# Patient Record
Sex: Male | Born: 1960 | Race: White | Hispanic: No | Smoking: Never smoker
Health system: Southern US, Community
[De-identification: ages and names within clinical notes are randomized; demographics above are authoritative.]

## PROBLEM LIST (undated history)

## (undated) DIAGNOSIS — G4733 Obstructive sleep apnea (adult) (pediatric): Secondary | ICD-10-CM

## (undated) DIAGNOSIS — H9190 Unspecified hearing loss, unspecified ear: Secondary | ICD-10-CM

## (undated) DIAGNOSIS — R42 Dizziness and giddiness: Secondary | ICD-10-CM

## (undated) DIAGNOSIS — N200 Calculus of kidney: Secondary | ICD-10-CM

## (undated) DIAGNOSIS — R011 Cardiac murmur, unspecified: Secondary | ICD-10-CM

## (undated) DIAGNOSIS — G8929 Other chronic pain: Secondary | ICD-10-CM

## (undated) DIAGNOSIS — M549 Dorsalgia, unspecified: Secondary | ICD-10-CM

## (undated) DIAGNOSIS — K219 Gastro-esophageal reflux disease without esophagitis: Secondary | ICD-10-CM

## (undated) HISTORY — PX: OTHER SURGICAL HISTORY: SHX169

## (undated) HISTORY — DX: Cardiac murmur, unspecified: R01.1

## (undated) HISTORY — DX: Obstructive sleep apnea (adult) (pediatric): G47.33

## (undated) HISTORY — DX: Calculus of kidney: N20.0

## (undated) HISTORY — DX: Unspecified hearing loss, unspecified ear: H91.90

## (undated) HISTORY — DX: Dizziness and giddiness: R42

## (undated) HISTORY — DX: Other chronic pain: G89.29

## (undated) HISTORY — DX: Gastro-esophageal reflux disease without esophagitis: K21.9

## (undated) HISTORY — DX: Dorsalgia, unspecified: M54.9

---

## 2010-12-20 DIAGNOSIS — G8929 Other chronic pain: Secondary | ICD-10-CM

## 2010-12-20 HISTORY — DX: Other chronic pain: G89.29

## 2018-03-24 ENCOUNTER — Telehealth: Payer: Self-pay

## 2018-03-24 NOTE — Telephone Encounter (Signed)
SENT REFERRAL TO SCHEDULING AND FILED NOTES 

## 2018-06-02 ENCOUNTER — Ambulatory Visit: Payer: Self-pay | Admitting: Cardiology

## 2018-06-03 ENCOUNTER — Encounter: Payer: Self-pay | Admitting: Cardiology

## 2018-06-17 NOTE — Progress Notes (Signed)
Referring-Sherry Ryter-Brown MD Reason for referral-murmur  HPI: 58 yo male for evaluation of murmur at request of Pricilla Holm MD. Patient is active.  Recently he has noticed that when he bikes up a heel vigorously he will note nausea and increased dyspnea at the top.  He otherwise denies dyspnea on exertion, orthopnea, PND, pedal edema, palpitations, syncope or chest pain.  Because of his murmur cardiology asked to evaluate.  Current Outpatient Medications  Medication Sig Dispense Refill  . MELATONIN PO Take 1 capsule by mouth at bedtime.      No current facility-administered medications for this visit.     No Known Allergies   Past Medical History:  Diagnosis Date  . Cardiac murmur   . Chronic back pain 12/20/2010  . GERD (gastroesophageal reflux disease)   . Hearing loss   . Nephrolithiasis   . OSA (obstructive sleep apnea)   . Vertigo     Past Surgical History:  Procedure Laterality Date  . tubes in ear      Social History   Socioeconomic History  . Marital status: Divorced    Spouse name: Not on file  . Number of children: Not on file  . Years of education: Not on file  . Highest education level: Not on file  Occupational History    Comment: Machinist  Social Needs  . Financial resource strain: Not on file  . Food insecurity:    Worry: Not on file    Inability: Not on file  . Transportation needs:    Medical: Not on file    Non-medical: Not on file  Tobacco Use  . Smoking status: Never Smoker  . Smokeless tobacco: Never Used  Substance and Sexual Activity  . Alcohol use: Yes    Comment: Rarely  . Drug use: Never  . Sexual activity: Not on file  Lifestyle  . Physical activity:    Days per week: Not on file    Minutes per session: Not on file  . Stress: Not on file  Relationships  . Social connections:    Talks on phone: Not on file    Gets together: Not on file    Attends religious service: Not on file    Active member of club or  organization: Not on file    Attends meetings of clubs or organizations: Not on file    Relationship status: Not on file  . Intimate partner violence:    Fear of current or ex partner: Not on file    Emotionally abused: Not on file    Physically abused: Not on file    Forced sexual activity: Not on file  Other Topics Concern  . Not on file  Social History Narrative  . Not on file    Family History  Problem Relation Age of Onset  . Lung cancer Mother   . Heart disease Father   . Hypertension Father     ROS: no fevers or chills, productive cough, hemoptysis, dysphasia, odynophagia, melena, hematochezia, dysuria, hematuria, rash, seizure activity, orthopnea, PND, pedal edema, claudication. Remaining systems are negative.  Physical Exam:   Blood pressure 118/84, pulse 78, height 5\' 11"  (1.803 m), weight 242 lb 12.8 oz (110.1 kg).  General:  Well developed/well nourished in NAD Skin warm/dry Patient not depressed No peripheral clubbing Back-normal HEENT-normal/normal eyelids Neck supple/normal carotid upstroke bilaterally; no bruits; no JVD; no thyromegaly chest - CTA/ normal expansion CV - RRR/normal S1 and S2; no rubs or gallops;  PMI  nondisplaced; 2-3/6 systolic murmur left sternal border radiating to carotids. Abdomen -NT/ND, no HSM, no mass, + bowel sounds, no bruit 2+ femoral pulses, no bruits Ext-no edema, chords, 2+ DP Neuro-grossly nonfocal  ECG -normal sinus rhythm at a rate of 78, no ST changes.  Personally reviewed  A/P  1 murmur-probable aortic stenosis murmur on examination.  We will arrange an echocardiogram to further assess.  If indeed he does have aortic stenosis it would likely be related to a bicuspid aortic valve given his young age.  If he has a bicuspid valve we will arrange a CTA to rule out thoracic aortic aneurysm.  I have asked him to not bike vigorously until results are known.  We also discussed aortic stenosis and that it is a progressive disease  and he could eventually require aortic valve replacement in the future if/when severe.  2 history of obstructive sleep apnea-Per primary care.  Olga MillersBrian Mickle Campton, MD

## 2018-06-23 ENCOUNTER — Ambulatory Visit (INDEPENDENT_AMBULATORY_CARE_PROVIDER_SITE_OTHER): Payer: BLUE CROSS/BLUE SHIELD | Admitting: Cardiology

## 2018-06-23 ENCOUNTER — Encounter: Payer: Self-pay | Admitting: Cardiology

## 2018-06-23 VITALS — BP 118/84 | HR 78 | Ht 71.0 in | Wt 242.8 lb

## 2018-06-23 DIAGNOSIS — I350 Nonrheumatic aortic (valve) stenosis: Secondary | ICD-10-CM | POA: Diagnosis not present

## 2018-06-23 DIAGNOSIS — R011 Cardiac murmur, unspecified: Secondary | ICD-10-CM | POA: Diagnosis not present

## 2018-06-23 NOTE — Patient Instructions (Signed)
Medication Instructions:   NO CHANGE  Testing/Procedures:  Your physician has requested that you have an echocardiogram. Echocardiography is a painless test that uses sound waves to create images of your heart. It provides your doctor with information about the size and shape of your heart and how well your heart's chambers and valves are working. This procedure takes approximately one hour. There are no restrictions for this procedure.  [address] [place]-1ST FLOOR IMAGING  Follow-Up:  Your physician recommends that you schedule a follow-up appointment in: 4 WEEKS WITH DR Jens Som

## 2018-07-01 ENCOUNTER — Ambulatory Visit (HOSPITAL_BASED_OUTPATIENT_CLINIC_OR_DEPARTMENT_OTHER)
Admission: RE | Admit: 2018-07-01 | Discharge: 2018-07-01 | Disposition: A | Discharge status: Home / Self Care | Payer: BLUE CROSS/BLUE SHIELD | Source: Ambulatory Visit | Attending: Cardiology | Admitting: Cardiology

## 2018-07-01 DIAGNOSIS — R011 Cardiac murmur, unspecified: Secondary | ICD-10-CM | POA: Insufficient documentation

## 2018-07-01 NOTE — Progress Notes (Signed)
  Echocardiogram 2D Echocardiogram has been performed.  Grant Crawford 07/01/2018, 4:02 PM

## 2018-07-08 ENCOUNTER — Telehealth: Payer: Self-pay | Admitting: *Deleted

## 2018-07-08 DIAGNOSIS — I712 Thoracic aortic aneurysm, without rupture, unspecified: Secondary | ICD-10-CM

## 2018-07-08 NOTE — Telephone Encounter (Signed)
pt aware of results scan will be scheduled in the [place] location 07-15-2018 @5 :30 pm. Patient aware no solid food 4 hours prior to scan.

## 2018-07-08 NOTE — Telephone Encounter (Signed)
-----   Message from Lewayne Bunting, MD sent at 07/04/2018  8:59 AM EST ----- Schedule CTA to assess aortic root Olga Millers

## 2018-07-15 ENCOUNTER — Encounter (HOSPITAL_BASED_OUTPATIENT_CLINIC_OR_DEPARTMENT_OTHER): Payer: Self-pay

## 2018-07-15 ENCOUNTER — Ambulatory Visit (HOSPITAL_BASED_OUTPATIENT_CLINIC_OR_DEPARTMENT_OTHER)
Admission: RE | Admit: 2018-07-15 | Discharge: 2018-07-15 | Disposition: A | Discharge status: Home / Self Care | Payer: BLUE CROSS/BLUE SHIELD | Source: Ambulatory Visit | Attending: Cardiology | Admitting: Cardiology

## 2018-07-15 DIAGNOSIS — I712 Thoracic aortic aneurysm, without rupture, unspecified: Secondary | ICD-10-CM

## 2018-07-15 MED ORDER — IOPAMIDOL (ISOVUE-370) INJECTION 76%
100.0000 mL | Freq: Once | INTRAVENOUS | Status: AC | PRN
  Administered 2018-07-15: 100 mL via INTRAVENOUS

## 2018-07-16 NOTE — Progress Notes (Signed)
HPI: FU AS.   Echocardiogram February 2020 showed normal LV function, moderate left ventricular hypertrophy, moderate aortic stenosis with mean gradient 21 mmHg, trace aortic insufficiency, moderately dilated ascending aorta at 45 mm.  CTA of thoracic aorta March 2020 revealed mild aneurysmal dilatation of a sending thoracic aorta at 4.3 cm; there was note of 6 mm and 5 mm left upper lobe and lingular nodules and follow-up CTA recommended; there was also note of upper abdominal lymph nodes.  Since last seen patient only has dyspnea with biking vigorously up hills but no other activities.  No orthopnea, PND, pedal edema, syncope or chest pain.  Current Outpatient Medications  Medication Sig Dispense Refill  . MELATONIN PO Take 1 capsule by mouth at bedtime.      No current facility-administered medications for this visit.      Past Medical History:  Diagnosis Date  . Cardiac murmur   . Chronic back pain 12/20/2010  . GERD (gastroesophageal reflux disease)   . Hearing loss   . Nephrolithiasis   . OSA (obstructive sleep apnea)   . Vertigo     Past Surgical History:  Procedure Laterality Date  . tubes in ear      Social History   Socioeconomic History  . Marital status: Divorced    Spouse name: Not on file  . Number of children: Not on file  . Years of education: Not on file  . Highest education level: Not on file  Occupational History    Comment: Machinist  Social Needs  . Financial resource strain: Not on file  . Food insecurity:    Worry: Not on file    Inability: Not on file  . Transportation needs:    Medical: Not on file    Non-medical: Not on file  Tobacco Use  . Smoking status: Never Smoker  . Smokeless tobacco: Never Used  Substance and Sexual Activity  . Alcohol use: Yes    Comment: Rarely  . Drug use: Never  . Sexual activity: Not on file  Lifestyle  . Physical activity:    Days per week: Not on file    Minutes per session: Not on file  .  Stress: Not on file  Relationships  . Social connections:    Talks on phone: Not on file    Gets together: Not on file    Attends religious service: Not on file    Active member of club or organization: Not on file    Attends meetings of clubs or organizations: Not on file    Relationship status: Not on file  . Intimate partner violence:    Fear of current or ex partner: Not on file    Emotionally abused: Not on file    Physically abused: Not on file    Forced sexual activity: Not on file  Other Topics Concern  . Not on file  Social History Narrative  . Not on file    Family History  Problem Relation Age of Onset  . Lung cancer Mother   . Heart disease Father   . Hypertension Father     ROS: no fevers or chills, productive cough, hemoptysis, dysphasia, odynophagia, melena, hematochezia, dysuria, hematuria, rash, seizure activity, orthopnea, PND, pedal edema, claudication. Remaining systems are negative.  Physical Exam: Well-developed well-nourished in no acute distress.  Skin is warm and dry.  HEENT is normal.  Neck is supple.  Chest is clear to auscultation with normal expansion.  Cardiovascular exam  is regular rate and rhythm. 2/6 systolic murmur Abdominal exam nontender or distended. No masses palpated. Extremities show no edema. neuro grossly intact  A/P  1 aortic stenosis-moderate on most recent echocardiogram.  He will need follow-up studies in the future.  I explained aortic stenosis to him in clinic today and the symptoms to be aware of including worsening dyspnea on exertion, chest pain and syncope.  I explained that he will likely require aortic valve replacement in the future.  2 thoracic aortic aneurysm-plan follow-up CTA March 2021.  3 pulmonary nodules/upper abdominal lymph nodes-follow-up CTA March 2021.  4 obstructive sleep apnea-Per primary care.  Olga Millers, MD

## 2018-07-21 ENCOUNTER — Ambulatory Visit (INDEPENDENT_AMBULATORY_CARE_PROVIDER_SITE_OTHER): Payer: BLUE CROSS/BLUE SHIELD | Admitting: Cardiology

## 2018-07-21 ENCOUNTER — Other Ambulatory Visit: Payer: Self-pay

## 2018-07-21 ENCOUNTER — Encounter: Payer: Self-pay | Admitting: Cardiology

## 2018-07-21 VITALS — BP 120/82 | HR 83 | Ht 71.0 in | Wt 245.8 lb

## 2018-07-21 DIAGNOSIS — I712 Thoracic aortic aneurysm, without rupture, unspecified: Secondary | ICD-10-CM

## 2018-07-21 DIAGNOSIS — I350 Nonrheumatic aortic (valve) stenosis: Secondary | ICD-10-CM | POA: Diagnosis not present

## 2018-07-21 NOTE — Patient Instructions (Signed)
Your physician wants you to follow-up in: 6 MONTHS WITH DR Jens Som You will receive a reminder letter in the mail two months in advance. If you don't receive a letter, please call our office to schedule the follow-up appointment.   CALL IN July TO SCHEDULE APPOINTMENT IN [place]

## 2019-07-13 ENCOUNTER — Encounter: Payer: Self-pay | Admitting: *Deleted

## 2020-02-06 ENCOUNTER — Telehealth: Payer: Self-pay | Admitting: Cardiology

## 2020-02-06 NOTE — Telephone Encounter (Signed)
I did not need this encounter. °

## 2020-03-20 IMAGING — CT CT ANGIO CHEST
2 of 6 series · 16 of 46 positions shown · IV contrast (APPLIED)
Comparison: Echocardiography report dated 07/01/2018

CLINICAL DATA: Aortic valve stenosis and dilated ascending thoracic
aorta by echocardiography.

EXAM:
CT ANGIOGRAPHY CHEST WITH CONTRAST
TECHNIQUE: Multidetector CT imaging of the chest was performed using the
standard protocol during bolus administration of intravenous
contrast. Multiplanar CT image reconstructions and MIPs were
obtained to evaluate the vascular anatomy.
CONTRAST:  100mL HVCK07-Q00 IOPAMIDOL (HVCK07-Q00) INJECTION 76%

[Series 4: axial arterial · axial · arterial · 0.83mm/px · z∈[-254,+34]mm · 13 of 108 slices shown]
[im 6/108  lung]
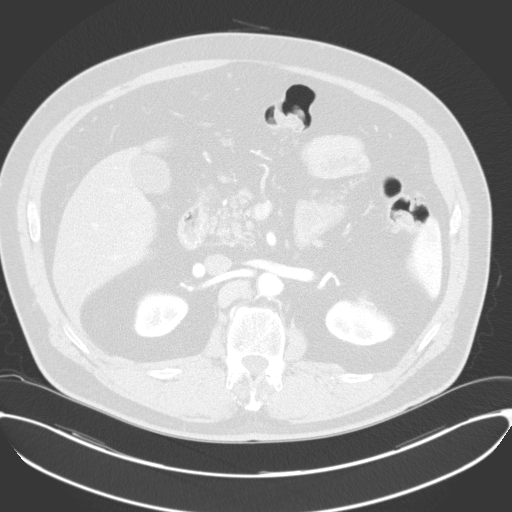
[im 17/108  soft-tissue]
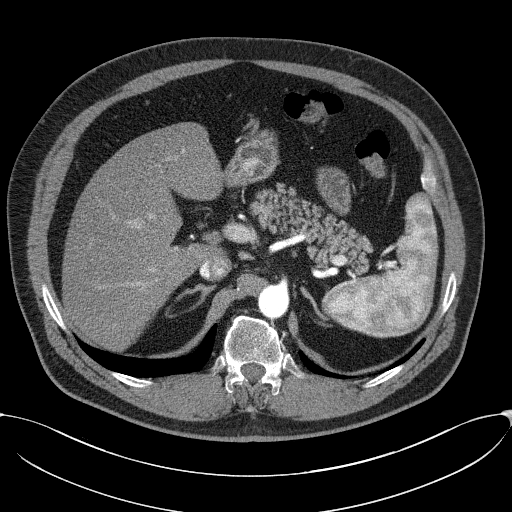
[im 23/108  lung]
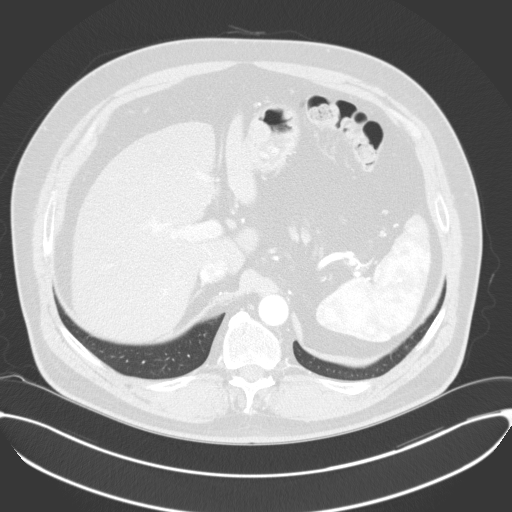
[im 29/108  soft-tissue]
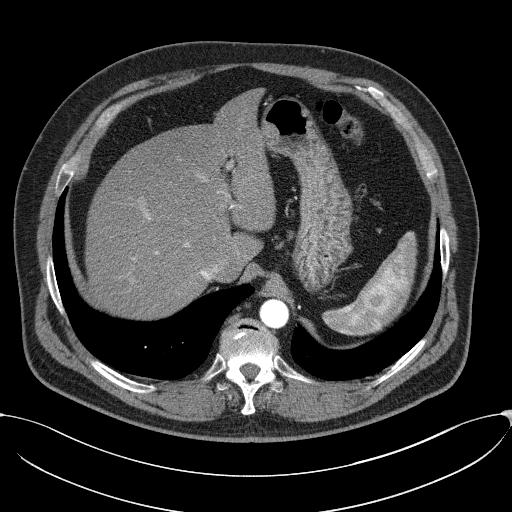
[im 40/108  lung]
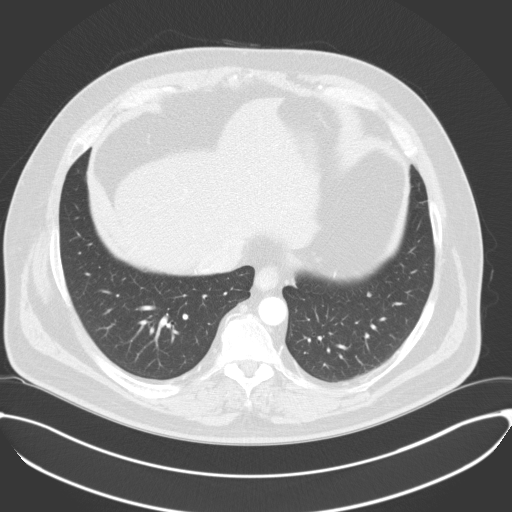
[im 46/108  soft-tissue]
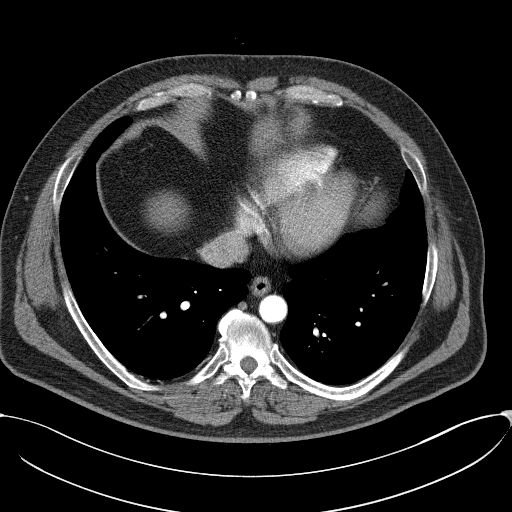
[im 57/108  lung]
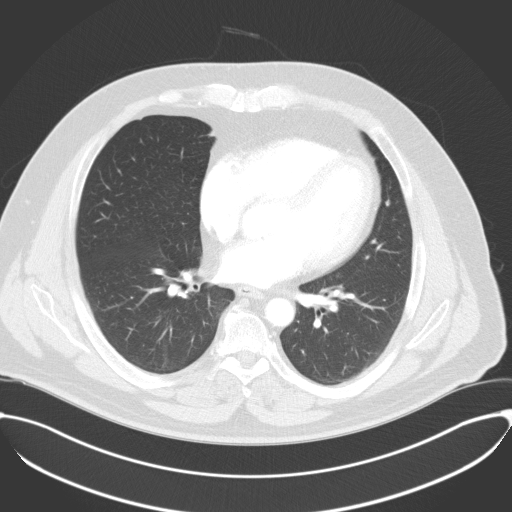
[im 62/108  soft-tissue]
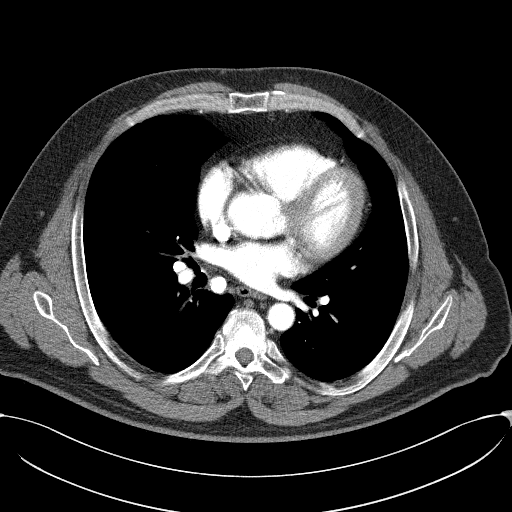
[im 68/108  lung]
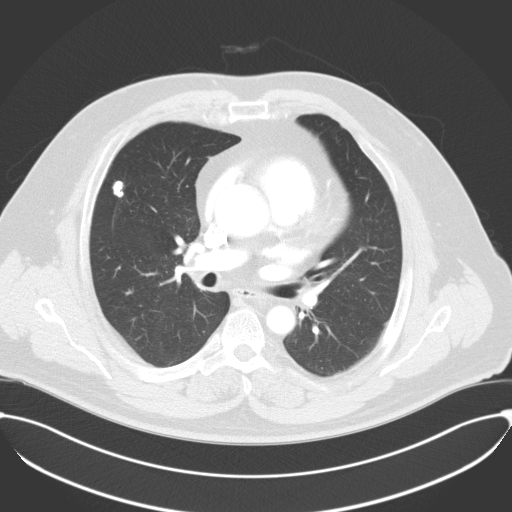
[im 79/108  soft-tissue]
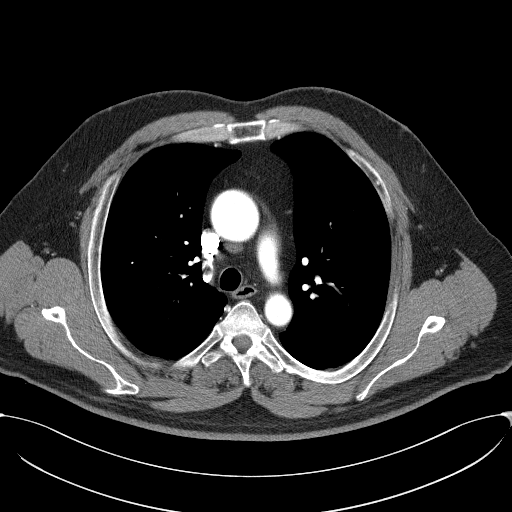
[im 85/108  lung]
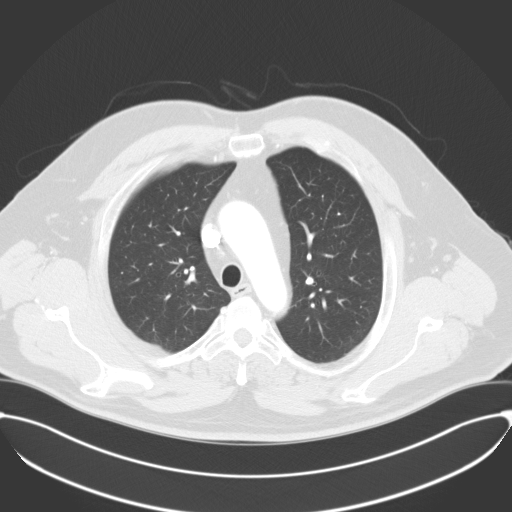
[im 91/108  soft-tissue]
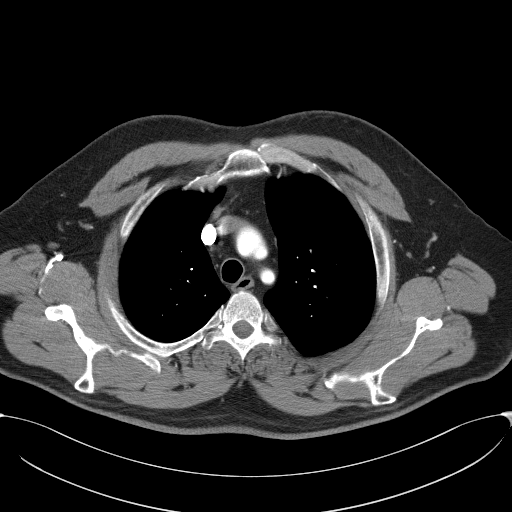
[im 102/108  lung]
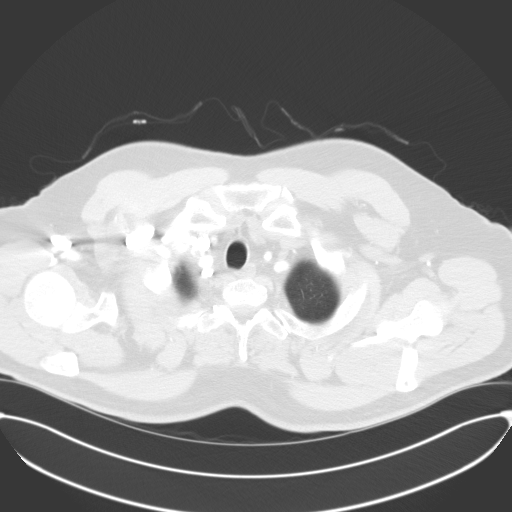

[Series 7: coronal · coronal · 0.65mm/px · 3 of 108 slices shown]
[im 27/108  soft-tissue]
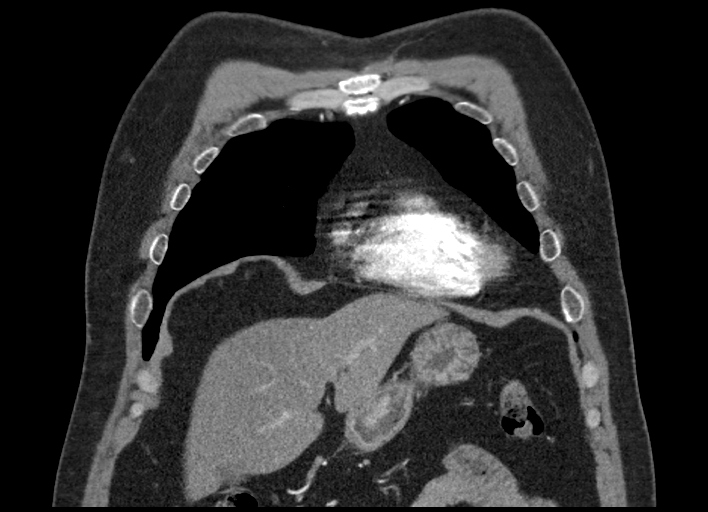
[im 54/108  soft-tissue]
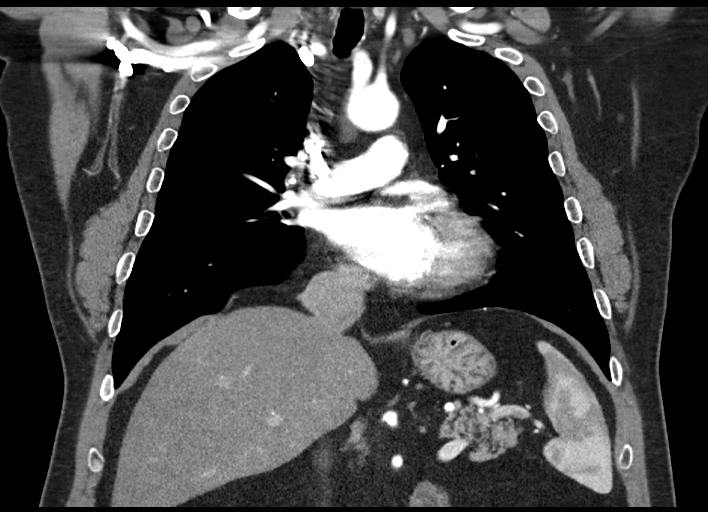
[im 81/108  soft-tissue]
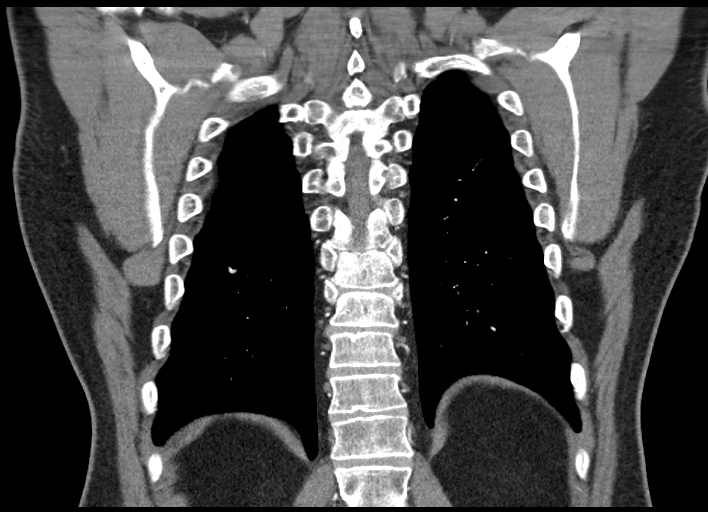

[16 of 46 positions shown; findings below may reference images not displayed]

FINDINGS: Cardiovascular: The aortic valve is calcified. The aortic root
measures approximately 3.9-4.1 cm at the level of the sinuses of
Valsalva. The ascending thoracic aorta is mildly dilated and reaches
a maximum caliber approximately 4.3 cm. The proximal arch measures
3.6 cm and the distal arch 2.5 cm. The descending thoracic aorta
measures 2.2 cm. Proximal great vessels show normal branching
anatomy and are widely patent. The heart size is normal. No
pericardial fluid identified. No significant calcified coronary
artery plaque identified. Central pulmonary arteries are normal in
caliber.

Mediastinum/Nodes: Several calcified right hilar and subcarinal
lymph nodes identified consistent with prior granulomatous disease.
No enlarged lymph nodes identified.

Lungs/Pleura: Densely calcified granulomata including 7 mm right
middle lobe granuloma abutting the minor fissure, 11 mm calcified
granuloma in the right middle lobe and 10 mm calcified granuloma in
the right lower lobe. Noncalcified 6 mm nodule of the medial left
upper lobe abuts the mediastinum. 5 mm inferior lingula nodule. No
evidence of airspace consolidation, edema, pleural fluid or
pneumothorax.

Upper Abdomen: The liver demonstrates diffuse steatosis. Nonspecific
scattered small upper abdominal lymph nodes. The largest measures 12
mm in short axis in a peripancreatic/paraceliac location.

Musculoskeletal: No chest wall abnormality. No acute or significant
osseous findings.

Review of the MIP images confirms the above findings.
IMPRESSION: 1. Mild aneurysmal disease of the ascending thoracic aorta with root
diameter of 3.9-4.1 cm at the level of the sinuses of Valsalva and
ascending diameter 4.3 cm. There is associated calcification of the
aortic valve. Recommend annual imaging followup by CTA or MRA. This
recommendation follows 9656
ACCF/AHA/AATS/ACR/ASA/SCA/LABELLE SALHA/LEVROS/GUSMAN JOSE/NAUJI BALDAI Guidelines for the
Diagnosis and Management of Patients with Thoracic Aortic Disease.
Circulation. 9656; 121: [id number]. Aortic aneurysm NOS (TLTRY-TOB.I)
2. Evidence of prior granulomatous disease with multiple calcified
right lung granulomata and calcified right hilar and mediastinal
lymph nodes. Noncalcified 6 mm and 5 mm left upper lobe and lingular
nodules also identified. These are likely benign. Given that the
ascending thoracic aorta caliber will need to be followed, CTA is
recommended over MRA so that the pulmonary nodules can be followed.
3. Diffuse hepatic steatosis. Nonspecific scattered upper abdominal
lymph nodes which are generally small with the largest measuring 12
mm in a peripancreatic/paraceliac location. These are likely
reactive/benign. These can also be reimaged on follow-up when the
thoracic aorta is reimaged.

Aortic aneurysm NOS (TLTRY-TOB.I).
